# Patient Record
Sex: Female | Born: 1940 | Race: White | Hispanic: No
Health system: Southern US, Community
[De-identification: ages and names within clinical notes are randomized; demographics above are authoritative.]

## PROBLEM LIST (undated history)

## (undated) DIAGNOSIS — M1990 Unspecified osteoarthritis, unspecified site: Secondary | ICD-10-CM

## (undated) DIAGNOSIS — E119 Type 2 diabetes mellitus without complications: Secondary | ICD-10-CM

## (undated) DIAGNOSIS — I10 Essential (primary) hypertension: Secondary | ICD-10-CM

## (undated) DIAGNOSIS — I639 Cerebral infarction, unspecified: Secondary | ICD-10-CM

## (undated) DIAGNOSIS — E039 Hypothyroidism, unspecified: Secondary | ICD-10-CM

## (undated) HISTORY — PX: APPENDECTOMY: SHX54

## (undated) HISTORY — PX: CHOLECYSTECTOMY: SHX55

## (undated) HISTORY — PX: ABDOMINAL HYSTERECTOMY: SHX81

## (undated) HISTORY — PX: EYE SURGERY: SHX253

## (undated) HISTORY — PX: CARDIAC CATHETERIZATION: SHX172

## (undated) HISTORY — PX: JOINT REPLACEMENT: SHX530

---

## 2013-09-18 ENCOUNTER — Other Ambulatory Visit: Payer: Self-pay | Admitting: Orthopedic Surgery

## 2013-09-18 DIAGNOSIS — M542 Cervicalgia: Secondary | ICD-10-CM

## 2013-09-23 ENCOUNTER — Other Ambulatory Visit: Payer: Self-pay

## 2013-09-27 ENCOUNTER — Ambulatory Visit
Admission: RE | Admit: 2013-09-27 | Discharge: 2013-09-27 | Disposition: A | Discharge status: Home / Self Care | Payer: Medicare Other | Source: Ambulatory Visit | Attending: Orthopedic Surgery | Admitting: Orthopedic Surgery

## 2013-09-27 DIAGNOSIS — M542 Cervicalgia: Secondary | ICD-10-CM

## 2014-05-20 DIAGNOSIS — H269 Unspecified cataract: Secondary | ICD-10-CM | POA: Insufficient documentation

## 2014-06-10 DIAGNOSIS — H269 Unspecified cataract: Secondary | ICD-10-CM | POA: Insufficient documentation

## 2015-02-04 ENCOUNTER — Other Ambulatory Visit: Payer: Self-pay | Admitting: Neurosurgery

## 2015-02-04 DIAGNOSIS — M5412 Radiculopathy, cervical region: Secondary | ICD-10-CM

## 2015-02-10 ENCOUNTER — Ambulatory Visit
Admission: RE | Admit: 2015-02-10 | Discharge: 2015-02-10 | Disposition: A | Discharge status: Home / Self Care | Payer: Medicare Other | Source: Ambulatory Visit | Attending: Neurosurgery | Admitting: Neurosurgery

## 2015-02-10 DIAGNOSIS — M5412 Radiculopathy, cervical region: Secondary | ICD-10-CM

## 2015-02-10 MED ORDER — IOHEXOL 300 MG/ML  SOLN
10.0000 mL | Freq: Once | INTRAMUSCULAR | Status: AC | PRN
  Administered 2015-02-10: 10 mL via INTRATHECAL

## 2015-02-10 MED ORDER — ONDANSETRON HCL 4 MG/2ML IJ SOLN
4.0000 mg | Freq: Once | INTRAMUSCULAR | Status: AC
  Administered 2015-02-10: 4 mg via INTRAMUSCULAR

## 2015-02-10 MED ORDER — DIAZEPAM 5 MG PO TABS
5.0000 mg | ORAL_TABLET | Freq: Once | ORAL | Status: AC
  Administered 2015-02-10: 5 mg via ORAL

## 2015-02-10 MED ORDER — MEPERIDINE HCL 100 MG/ML IJ SOLN
100.0000 mg | Freq: Once | INTRAMUSCULAR | Status: AC
  Administered 2015-02-10: 100 mg via INTRAMUSCULAR

## 2015-02-10 NOTE — Progress Notes (Signed)
Patient states she has taken the prep for the iodinated contrast allergy as prescribed by Dr. Gerlene FeeKritzer.  She states she has been off Plavix since 02/06/15.  jkl

## 2015-02-10 NOTE — Discharge Instructions (Signed)

## 2015-02-21 ENCOUNTER — Other Ambulatory Visit: Payer: Self-pay | Admitting: Neurosurgery

## 2015-03-07 ENCOUNTER — Encounter (HOSPITAL_COMMUNITY)
Admission: RE | Admit: 2015-03-07 | Discharge: 2015-03-07 | Disposition: A | Discharge status: Home / Self Care | Payer: Medicare Other | Source: Ambulatory Visit | Attending: Neurosurgery | Admitting: Neurosurgery

## 2015-03-07 ENCOUNTER — Encounter (HOSPITAL_COMMUNITY): Payer: Self-pay

## 2015-03-07 DIAGNOSIS — Z01810 Encounter for preprocedural cardiovascular examination: Secondary | ICD-10-CM | POA: Diagnosis not present

## 2015-03-07 DIAGNOSIS — I10 Essential (primary) hypertension: Secondary | ICD-10-CM | POA: Diagnosis not present

## 2015-03-07 DIAGNOSIS — Z01812 Encounter for preprocedural laboratory examination: Secondary | ICD-10-CM | POA: Diagnosis present

## 2015-03-07 DIAGNOSIS — E119 Type 2 diabetes mellitus without complications: Secondary | ICD-10-CM | POA: Diagnosis not present

## 2015-03-07 DIAGNOSIS — Z01818 Encounter for other preprocedural examination: Secondary | ICD-10-CM | POA: Diagnosis not present

## 2015-03-07 DIAGNOSIS — R000 Tachycardia, unspecified: Secondary | ICD-10-CM | POA: Insufficient documentation

## 2015-03-07 HISTORY — DX: Hypothyroidism, unspecified: E03.9

## 2015-03-07 HISTORY — DX: Unspecified osteoarthritis, unspecified site: M19.90

## 2015-03-07 HISTORY — DX: Cerebral infarction, unspecified: I63.9

## 2015-03-07 HISTORY — DX: Essential (primary) hypertension: I10

## 2015-03-07 HISTORY — DX: Type 2 diabetes mellitus without complications: E11.9

## 2015-03-07 LAB — BASIC METABOLIC PANEL
Anion gap: 6 (ref 5–15)
BUN: 15 mg/dL (ref 6–23)
CHLORIDE: 103 mmol/L (ref 96–112)
CO2: 26 mmol/L (ref 19–32)
Calcium: 9.2 mg/dL (ref 8.4–10.5)
Creatinine, Ser: 0.71 mg/dL (ref 0.50–1.10)
GFR calc Af Amer: >90 mL/min (ref >90)
GFR calc non Af Amer: 84 mL/min — ABNORMAL LOW (ref >90)
GLUCOSE: 143 mg/dL — AB (ref 70–99)
POTASSIUM: 3.9 mmol/L (ref 3.5–5.1)
SODIUM: 135 mmol/L (ref 135–145)

## 2015-03-07 LAB — CBC
HEMATOCRIT: 34.3 % — AB (ref 36.0–46.0)
Hemoglobin: 10.9 g/dL — ABNORMAL LOW (ref 12.0–15.0)
MCH: 25.2 pg — ABNORMAL LOW (ref 26.0–34.0)
MCHC: 31.8 g/dL (ref 30.0–36.0)
MCV: 79.4 fL (ref 78.0–100.0)
PLATELETS: 377 10*3/uL (ref 150–400)
RBC: 4.32 MIL/uL (ref 3.87–5.11)
RDW: 14.8 % (ref 11.5–15.5)
WBC: 9.1 10*3/uL (ref 4.0–10.5)

## 2015-03-07 LAB — SURGICAL PCR SCREEN
MRSA, PCR: NEGATIVE
STAPHYLOCOCCUS AUREUS: NEGATIVE

## 2015-03-07 NOTE — Pre-Procedure Instructions (Signed)
Kim Rivas  03/07/2015   Your procedure is scheduled on:  03/15/15  Report to [facility]mitting at 530 AM.  Call this number if you have problems the morning of surgery: [phone]   Remember:   Do not eat food or drink liquids after midnight.   Take these medicines the morning of surgery with A SIP OF WATER: cardizem,nexium.synthroid   Do not wear jewelry, make-up or nail polish.  Do not wear lotions, powders, or perfumes. You may wear deodorant.  Do not shave 48 hours prior to surgery. Men may shave face and neck.  Do not bring valuables to the hospital.  [name] is not responsible                  for any belongings or valuables.               Contacts, dentures or bridgework may not be worn into surgery.  Leave suitcase in the car. After surgery it may be brought to your room.  For patients admitted to the hospital, discharge time is determined by your                treatment team.               Patients discharged the day of surgery will not be allowed to drive  home.  Name and phone number of your driver: family  Special Instructions: Shower using CHG 2 nights before surgery and the night before surgery.  If you shower the day of surgery use CHG.  Use special wash - you have one bottle of CHG for all showers.  You should use approximately 1/3 of the bottle for each shower.   Please read over the following fact sheets that you were given: Pain Booklet, Coughing and Deep Breathing, Blood Transfusion Information, MRSA Information and Surgical Site Infection Prevention  

## 2015-03-07 NOTE — Pre-Procedure Instructions (Signed)
Tonna BoehringerBetty Rivas  03/07/2015   Your procedure is scheduled on:  03/15/15  [facility] at 530 AM.  Call this number if you have problems the morning of surgery: [phone]   Remember:   Do not eat food or drink liquids after midnight.   Take these medicines the morning of surgery with A SIP OF WATER: cardizem,nexium.synthroid   Do not wear jewelry, make-up or nail polish.  Do not wear lotions, powders, or perfumes. You may wear deodorant.  Do not shave 48 hours prior to surgery. Men may shave face and neck.  Do not bring valuables to the hospital.  [facility] is not responsible                  for any belongings or valuables.               Contacts, dentures or bridgework may not be worn into surgery.  Leave suitcase in the car. After surgery it may be brought to your room.  For patients admitted to the hospital, discharge time is determined by your                treatment team.               Patients discharged the day of surgery will not be allowed to drive  home.  Name and phone number of your driver: family  Special Instructions: Shower using CHG 2 nights before surgery and the night before surgery.  If you shower the day of surgery use CHG.  Use special wash - you have one bottle of CHG for all showers.  You should use approximately 1/3 of the bottle for each shower.   Please read over the following fact sheets that you were given: Pain Booklet, Coughing and Deep Breathing, Blood Transfusion Information, MRSA Information and Surgical Site Infection Prevention

## 2015-03-08 ENCOUNTER — Encounter (HOSPITAL_COMMUNITY): Payer: Self-pay

## 2015-03-08 NOTE — Progress Notes (Signed)
Anesthesia Chart Review:  Patient is a 74 year old female scheduled for C5-6 ACDF on 03/15/15 by Dr. Gerlene FeeKritzer.  History includes HTN, DM2, remote history of TIA, hypothyroidism, arthritis, appendectomy, hysterectomy, cataract extraction '15. She reports she is a remote former smoker. PCP is Dr. Tarri Fullerichard Escajeda. Greater than 10 years ago she was evaluated at [facility] for tachycardia (SVT?) that ultimately resolved with IV medication.  She was seen by cardiologist Dr. Chales Abrahamsyson and was placed on verapamil--last year her PCP switched her to diltiazem due to verapamil no longer available.  She denies any recurrent tachy-palpitations since her initial presentation. She denied SOB and CP.  She is no longer followed by cardiology.     Meds include Plavix, diltiazem, Nexium, HCTZ, levothyroxine, losartan, metformin. Patient reported being instructed to hold Plavix starting 03/10/15.  EKG 03/07/15: ST at 114 bpm. She reported she was having a significant amount of left elbow pain.   Cardiac cath 06/30/04 ([facility]PR; done following false positive stress test on 06/05/04): Right dominant, normal coronary arteries, normal LV systolic function.  Preoperative labs noted. She reports her fasting glucose levels are typically ~ 150-160.  If no acute changes then I anticipate that she can proceed as planned.  Of note, she feels she may need something IV to relax her prior to surgery. I told her she could discuss with her anesthesia team on arrival the day of surgery.  She is currently a first case.  Velna Ochsllison Keita Demarco, PA-C [facility]Anesthesiology Phone [phone] 03/08/2015 5:08 PM

## 2015-03-14 MED ORDER — DEXAMETHASONE SODIUM PHOSPHATE 10 MG/ML IJ SOLN
10.0000 mg | INTRAMUSCULAR | Status: AC
Start: 2015-03-15 — End: 2015-03-15
  Administered 2015-03-15: 10 mg via INTRAVENOUS
  Filled 2015-03-14: qty 1

## 2015-03-14 MED ORDER — SODIUM CHLORIDE 0.9 % IV SOLN
1500.0000 mg | INTRAVENOUS | Status: AC
  Administered 2015-03-15: 1500 mg via INTRAVENOUS
  Filled 2015-03-14: qty 1500

## 2015-03-15 ENCOUNTER — Inpatient Hospital Stay (HOSPITAL_COMMUNITY)
Admission: RE | Admit: 2015-03-15 | Discharge: 2015-03-16 | DRG: 983 | Disposition: A | Discharge status: Home / Self Care | Payer: Medicare Other | Source: Ambulatory Visit | Attending: Neurosurgery | Admitting: Neurosurgery

## 2015-03-15 ENCOUNTER — Encounter (HOSPITAL_COMMUNITY): Payer: Self-pay

## 2015-03-15 ENCOUNTER — Encounter (HOSPITAL_COMMUNITY)
Admission: RE | Disposition: A | Discharge status: Home / Self Care | Payer: Self-pay | Source: Ambulatory Visit | Attending: Neurosurgery

## 2015-03-15 ENCOUNTER — Inpatient Hospital Stay (HOSPITAL_COMMUNITY): Payer: Medicare Other

## 2015-03-15 ENCOUNTER — Inpatient Hospital Stay (HOSPITAL_COMMUNITY): Payer: Medicare Other | Admitting: Vascular Surgery

## 2015-03-15 ENCOUNTER — Inpatient Hospital Stay (HOSPITAL_COMMUNITY): Payer: Medicare Other | Admitting: Critical Care Medicine

## 2015-03-15 DIAGNOSIS — Z7902 Long term (current) use of antithrombotics/antiplatelets: Secondary | ICD-10-CM | POA: Diagnosis not present

## 2015-03-15 DIAGNOSIS — Z8673 Personal history of transient ischemic attack (TIA), and cerebral infarction without residual deficits: Secondary | ICD-10-CM | POA: Diagnosis not present

## 2015-03-15 DIAGNOSIS — M79602 Pain in left arm: Secondary | ICD-10-CM | POA: Diagnosis present

## 2015-03-15 DIAGNOSIS — I10 Essential (primary) hypertension: Principal | ICD-10-CM | POA: Diagnosis present

## 2015-03-15 DIAGNOSIS — M5412 Radiculopathy, cervical region: Secondary | ICD-10-CM | POA: Diagnosis present

## 2015-03-15 DIAGNOSIS — M4722 Other spondylosis with radiculopathy, cervical region: Secondary | ICD-10-CM | POA: Diagnosis present

## 2015-03-15 DIAGNOSIS — E119 Type 2 diabetes mellitus without complications: Secondary | ICD-10-CM | POA: Diagnosis present

## 2015-03-15 DIAGNOSIS — E039 Hypothyroidism, unspecified: Secondary | ICD-10-CM | POA: Diagnosis present

## 2015-03-15 DIAGNOSIS — Z419 Encounter for procedure for purposes other than remedying health state, unspecified: Secondary | ICD-10-CM

## 2015-03-15 DIAGNOSIS — K219 Gastro-esophageal reflux disease without esophagitis: Secondary | ICD-10-CM | POA: Diagnosis present

## 2015-03-15 HISTORY — PX: ANTERIOR CERVICAL DECOMP/DISCECTOMY FUSION: SHX1161

## 2015-03-15 LAB — GLUCOSE, CAPILLARY
GLUCOSE-CAPILLARY: 155 mg/dL — AB (ref 70–99)
GLUCOSE-CAPILLARY: 189 mg/dL — AB (ref 70–99)
Glucose-Capillary: 215 mg/dL — ABNORMAL HIGH (ref 70–99)
Glucose-Capillary: 153 mg/dL — ABNORMAL HIGH (ref 70–99)
Glucose-Capillary: 127 mg/dL — ABNORMAL HIGH (ref 70–99)

## 2015-03-15 SURGERY — ANTERIOR CERVICAL DECOMPRESSION/DISCECTOMY FUSION 1 LEVEL
Anesthesia: General | Site: Spine Cervical

## 2015-03-15 MED ORDER — PHENYLEPHRINE HCL 10 MG/ML IJ SOLN
10.0000 mg | INTRAVENOUS | Status: DC | PRN
  Administered 2015-03-15: 25 ug/min via INTRAVENOUS

## 2015-03-15 MED ORDER — METHOCARBAMOL 500 MG PO TABS
500.0000 mg | ORAL_TABLET | Freq: Four times a day (QID) | ORAL | Status: DC | PRN
Start: 2015-03-15 — End: 2015-03-16
  Administered 2015-03-15 – 2015-03-16 (×2): 500 mg via ORAL
  Filled 2015-03-15: qty 1

## 2015-03-15 MED ORDER — POTASSIUM CHLORIDE IN NACL 20-0.45 MEQ/L-% IV SOLN
INTRAVENOUS | Status: DC
  Administered 2015-03-15: 13:00:00 via INTRAVENOUS
  Filled 2015-03-15 (×3): qty 1000

## 2015-03-15 MED ORDER — ARTIFICIAL TEARS OP OINT
TOPICAL_OINTMENT | OPHTHALMIC | Status: AC
  Filled 2015-03-15: qty 3.5

## 2015-03-15 MED ORDER — SODIUM CHLORIDE 0.9 % IJ SOLN
3.0000 mL | Freq: Two times a day (BID) | INTRAMUSCULAR | Status: DC
  Administered 2015-03-15: 3 mL via INTRAVENOUS

## 2015-03-15 MED ORDER — ROCURONIUM BROMIDE 100 MG/10ML IV SOLN
INTRAVENOUS | Status: DC | PRN
  Administered 2015-03-15: 30 mg via INTRAVENOUS

## 2015-03-15 MED ORDER — METHOCARBAMOL 500 MG PO TABS
ORAL_TABLET | ORAL | Status: AC
  Filled 2015-03-15: qty 1

## 2015-03-15 MED ORDER — PROPOFOL 10 MG/ML IV BOLUS
INTRAVENOUS | Status: DC | PRN
  Administered 2015-03-15: 40 mg via INTRAVENOUS
  Administered 2015-03-15: 160 mg via INTRAVENOUS

## 2015-03-15 MED ORDER — HYDROCODONE-ACETAMINOPHEN 5-325 MG PO TABS
ORAL_TABLET | ORAL | Status: AC
  Filled 2015-03-15: qty 2

## 2015-03-15 MED ORDER — PANTOPRAZOLE SODIUM 40 MG IV SOLR
40.0000 mg | Freq: Every day | INTRAVENOUS | Status: DC
  Administered 2015-03-15: 40 mg via INTRAVENOUS
  Filled 2015-03-15 (×2): qty 40

## 2015-03-15 MED ORDER — LACTATED RINGERS IV SOLN
INTRAVENOUS | Status: DC | PRN
  Administered 2015-03-15: 07:00:00 via INTRAVENOUS

## 2015-03-15 MED ORDER — LOSARTAN POTASSIUM 50 MG PO TABS
50.0000 mg | ORAL_TABLET | Freq: Every day | ORAL | Status: DC
  Administered 2015-03-16: 50 mg via ORAL
  Filled 2015-03-15 (×2): qty 1

## 2015-03-15 MED ORDER — GLYCOPYRROLATE 0.2 MG/ML IJ SOLN
INTRAMUSCULAR | Status: AC
  Filled 2015-03-15: qty 3

## 2015-03-15 MED ORDER — INSULIN ASPART 100 UNIT/ML ~~LOC~~ SOLN
0.0000 [IU] | Freq: Three times a day (TID) | SUBCUTANEOUS | Status: DC
  Administered 2015-03-15: 3 [IU] via SUBCUTANEOUS
  Administered 2015-03-15: 5 [IU] via SUBCUTANEOUS
  Administered 2015-03-16: 2 [IU] via SUBCUTANEOUS

## 2015-03-15 MED ORDER — FENTANYL CITRATE 0.05 MG/ML IJ SOLN
INTRAMUSCULAR | Status: AC
  Filled 2015-03-15: qty 5

## 2015-03-15 MED ORDER — 0.9 % SODIUM CHLORIDE (POUR BTL) OPTIME
TOPICAL | Status: DC | PRN
  Administered 2015-03-15: 1000 mL

## 2015-03-15 MED ORDER — PHENOL 1.4 % MT LIQD
1.0000 | OROMUCOSAL | Status: DC | PRN
  Administered 2015-03-15: 1 via OROMUCOSAL
  Filled 2015-03-15: qty 177

## 2015-03-15 MED ORDER — ARTIFICIAL TEARS OP OINT
TOPICAL_OINTMENT | OPHTHALMIC | Status: DC | PRN
  Administered 2015-03-15: 1 via OPHTHALMIC

## 2015-03-15 MED ORDER — ONDANSETRON HCL 4 MG/2ML IJ SOLN
INTRAMUSCULAR | Status: DC | PRN
  Administered 2015-03-15 (×2): 4 mg via INTRAVENOUS

## 2015-03-15 MED ORDER — MIDAZOLAM HCL 5 MG/5ML IJ SOLN
INTRAMUSCULAR | Status: DC | PRN
  Administered 2015-03-15: 2 mg via INTRAVENOUS

## 2015-03-15 MED ORDER — SODIUM CHLORIDE 0.9 % IR SOLN
Status: DC | PRN
  Administered 2015-03-15: 500 mL

## 2015-03-15 MED ORDER — PROMETHAZINE HCL 25 MG/ML IJ SOLN: 6.2500 mg | INTRAMUSCULAR | Status: DC | PRN

## 2015-03-15 MED ORDER — INSULIN ASPART 100 UNIT/ML ~~LOC~~ SOLN
4.0000 [IU] | Freq: Three times a day (TID) | SUBCUTANEOUS | Status: DC
  Administered 2015-03-15 – 2015-03-16 (×3): 4 [IU] via SUBCUTANEOUS

## 2015-03-15 MED ORDER — INSULIN ASPART 100 UNIT/ML ~~LOC~~ SOLN: 0.0000 [IU] | Freq: Every day | SUBCUTANEOUS | Status: DC

## 2015-03-15 MED ORDER — FENTANYL CITRATE 0.05 MG/ML IJ SOLN
INTRAMUSCULAR | Status: DC | PRN
  Administered 2015-03-15 (×5): 50 ug via INTRAVENOUS

## 2015-03-15 MED ORDER — ROCURONIUM BROMIDE 50 MG/5ML IV SOLN
INTRAVENOUS | Status: AC
  Filled 2015-03-15: qty 1

## 2015-03-15 MED ORDER — ACETAMINOPHEN 650 MG RE SUPP
650.0000 mg | RECTAL | Status: DC | PRN
Start: 2015-03-15 — End: 2015-03-16

## 2015-03-15 MED ORDER — SODIUM CHLORIDE 0.9 % IV SOLN
INTRAVENOUS | Status: DC | PRN
  Administered 2015-03-15: 08:00:00 via INTRAVENOUS

## 2015-03-15 MED ORDER — VANCOMYCIN HCL IN DEXTROSE 1-5 GM/200ML-% IV SOLN
1000.0000 mg | Freq: Once | INTRAVENOUS | Status: AC
  Administered 2015-03-15: 1000 mg via INTRAVENOUS
  Filled 2015-03-15: qty 200

## 2015-03-15 MED ORDER — PHENYLEPHRINE 40 MCG/ML (10ML) SYRINGE FOR IV PUSH (FOR BLOOD PRESSURE SUPPORT)
PREFILLED_SYRINGE | INTRAVENOUS | Status: AC
  Filled 2015-03-15: qty 10

## 2015-03-15 MED ORDER — THROMBIN 5000 UNITS EX SOLR
CUTANEOUS | Status: DC | PRN
  Administered 2015-03-15 (×2): 5000 [IU] via TOPICAL

## 2015-03-15 MED ORDER — LIDOCAINE HCL (CARDIAC) 20 MG/ML IV SOLN
INTRAVENOUS | Status: DC | PRN
  Administered 2015-03-15: 80 mg via INTRAVENOUS
  Administered 2015-03-15: 100 mg via INTRATRACHEAL

## 2015-03-15 MED ORDER — PROMETHAZINE HCL 25 MG/ML IJ SOLN
12.5000 mg | Freq: Four times a day (QID) | INTRAMUSCULAR | Status: DC | PRN
  Administered 2015-03-15: 12.5 mg via INTRAVENOUS
  Filled 2015-03-15: qty 1

## 2015-03-15 MED ORDER — METHOCARBAMOL 1000 MG/10ML IJ SOLN
500.0000 mg | INTRAMUSCULAR | Status: DC
  Filled 2015-03-15: qty 5

## 2015-03-15 MED ORDER — LIDOCAINE HCL (CARDIAC) 20 MG/ML IV SOLN
INTRAVENOUS | Status: AC
  Filled 2015-03-15: qty 5

## 2015-03-15 MED ORDER — EPHEDRINE SULFATE 50 MG/ML IJ SOLN
INTRAMUSCULAR | Status: AC
Start: 2015-03-15 — End: 2015-03-15
  Filled 2015-03-15: qty 1

## 2015-03-15 MED ORDER — SODIUM CHLORIDE 0.9 % IJ SOLN
INTRAMUSCULAR | Status: AC
  Filled 2015-03-15: qty 10

## 2015-03-15 MED ORDER — DOCUSATE SODIUM 100 MG PO CAPS
100.0000 mg | ORAL_CAPSULE | Freq: Two times a day (BID) | ORAL | Status: DC
  Administered 2015-03-16: 100 mg via ORAL
  Filled 2015-03-15: qty 1

## 2015-03-15 MED ORDER — ONDANSETRON HCL 4 MG/2ML IJ SOLN
INTRAMUSCULAR | Status: AC
  Filled 2015-03-15: qty 2

## 2015-03-15 MED ORDER — HEMOSTATIC AGENTS (NO CHARGE) OPTIME
TOPICAL | Status: DC | PRN
  Administered 2015-03-15: 1 via TOPICAL

## 2015-03-15 MED ORDER — MIDAZOLAM HCL 2 MG/2ML IJ SOLN
INTRAMUSCULAR | Status: AC
  Filled 2015-03-15: qty 2

## 2015-03-15 MED ORDER — HYDROMORPHONE HCL 1 MG/ML IJ SOLN
INTRAMUSCULAR | Status: AC
  Filled 2015-03-15: qty 1

## 2015-03-15 MED ORDER — HYDROCHLOROTHIAZIDE 25 MG PO TABS
12.5000 mg | ORAL_TABLET | Freq: Every day | ORAL | Status: DC
  Filled 2015-03-15: qty 0.5

## 2015-03-15 MED ORDER — PROPOFOL 10 MG/ML IV BOLUS
INTRAVENOUS | Status: AC
  Filled 2015-03-15: qty 20

## 2015-03-15 MED ORDER — SODIUM CHLORIDE 0.9 % IJ SOLN: 3.0000 mL | INTRAMUSCULAR | Status: DC | PRN

## 2015-03-15 MED ORDER — HYDROMORPHONE HCL 1 MG/ML IJ SOLN
1.0000 mg | INTRAMUSCULAR | Status: DC | PRN
  Administered 2015-03-15: 1 mg via INTRAMUSCULAR
  Filled 2015-03-15: qty 1

## 2015-03-15 MED ORDER — ONDANSETRON HCL 4 MG/2ML IJ SOLN
4.0000 mg | INTRAMUSCULAR | Status: DC | PRN
  Administered 2015-03-15 (×2): 4 mg via INTRAVENOUS
  Filled 2015-03-15: qty 2

## 2015-03-15 MED ORDER — GLYCOPYRROLATE 0.2 MG/ML IJ SOLN
INTRAMUSCULAR | Status: DC | PRN
  Administered 2015-03-15: 0.6 mg via INTRAVENOUS

## 2015-03-15 MED ORDER — HYDROCHLOROTHIAZIDE 12.5 MG PO CAPS
12.5000 mg | ORAL_CAPSULE | Freq: Every day | ORAL | Status: DC
  Administered 2015-03-16: 12.5 mg via ORAL
  Filled 2015-03-15 (×2): qty 1

## 2015-03-15 MED ORDER — HYDROMORPHONE HCL 1 MG/ML IJ SOLN
0.2500 mg | INTRAMUSCULAR | Status: DC | PRN
  Administered 2015-03-15 (×4): 0.5 mg via INTRAVENOUS

## 2015-03-15 MED ORDER — MENTHOL 3 MG MT LOZG: 1.0000 | LOZENGE | OROMUCOSAL | Status: DC | PRN

## 2015-03-15 MED ORDER — DILTIAZEM HCL 60 MG PO TABS
120.0000 mg | ORAL_TABLET | Freq: Every day | ORAL | Status: DC
  Administered 2015-03-16: 120 mg via ORAL
  Filled 2015-03-15: qty 2

## 2015-03-15 MED ORDER — ACETAMINOPHEN 325 MG PO TABS: 650.0000 mg | ORAL_TABLET | ORAL | Status: DC | PRN

## 2015-03-15 MED ORDER — PHENYLEPHRINE HCL 10 MG/ML IJ SOLN
INTRAMUSCULAR | Status: DC | PRN
  Administered 2015-03-15: 40 ug via INTRAVENOUS
  Administered 2015-03-15 (×3): 80 ug via INTRAVENOUS
  Administered 2015-03-15: 120 ug via INTRAVENOUS

## 2015-03-15 MED ORDER — LEVOTHYROXINE SODIUM 50 MCG PO TABS
50.0000 ug | ORAL_TABLET | Freq: Every day | ORAL | Status: DC
  Administered 2015-03-16: 50 ug via ORAL
  Filled 2015-03-15 (×2): qty 1

## 2015-03-15 MED ORDER — METFORMIN HCL 500 MG PO TABS
500.0000 mg | ORAL_TABLET | Freq: Two times a day (BID) | ORAL | Status: DC
  Administered 2015-03-15 – 2015-03-16 (×2): 500 mg via ORAL
  Filled 2015-03-15 (×4): qty 1

## 2015-03-15 MED ORDER — HYDROCODONE-ACETAMINOPHEN 5-325 MG PO TABS
1.0000 | ORAL_TABLET | ORAL | Status: DC | PRN
  Administered 2015-03-15 – 2015-03-16 (×2): 2 via ORAL
  Filled 2015-03-15: qty 2

## 2015-03-15 MED ORDER — DEXTROSE 5 % IV SOLN: 500.0000 mg | Freq: Four times a day (QID) | INTRAVENOUS | Status: DC | PRN

## 2015-03-15 MED ORDER — NEOSTIGMINE METHYLSULFATE 10 MG/10ML IV SOLN
INTRAVENOUS | Status: DC | PRN
  Administered 2015-03-15: 4 mg via INTRAVENOUS

## 2015-03-15 MED ORDER — SUCCINYLCHOLINE CHLORIDE 20 MG/ML IJ SOLN
INTRAMUSCULAR | Status: AC
  Filled 2015-03-15: qty 1

## 2015-03-15 SURGICAL SUPPLY — 62 items
BAG DECANTER FOR FLEXI CONT (MISCELLANEOUS) ×3 IMPLANT
BENZOIN TINCTURE PRP APPL 2/3 (GAUZE/BANDAGES/DRESSINGS) ×3 IMPLANT
BIT DRILL INVIZIA (BIT) ×1 IMPLANT
BRUSH SCRUB EZ PLAIN DRY (MISCELLANEOUS) ×3 IMPLANT
BUR MATCHSTICK NEURO 3.0 LAGG (BURR) ×3 IMPLANT
CANISTER SUCT 3000ML PPV (MISCELLANEOUS) ×3 IMPLANT
CLOSURE WOUND 1/2 X4 (GAUZE/BANDAGES/DRESSINGS) ×1
CONT SPEC 4OZ CLIKSEAL STRL BL (MISCELLANEOUS) ×3 IMPLANT
DRAPE C-ARM 42X72 X-RAY (DRAPES) ×6 IMPLANT
DRAPE LAPAROTOMY 100X72 PEDS (DRAPES) ×3 IMPLANT
DRAPE MICROSCOPE LEICA (MISCELLANEOUS) ×3 IMPLANT
DRAPE POUCH INSTRU U-SHP 10X18 (DRAPES) ×3 IMPLANT
DRAPE SURG 17X23 STRL (DRAPES) ×6 IMPLANT
DRILL BIT INVIZIA (BIT) ×3
DRSG OPSITE POSTOP 3X4 (GAUZE/BANDAGES/DRESSINGS) ×3 IMPLANT
DRSG TELFA 3X8 NADH (GAUZE/BANDAGES/DRESSINGS) IMPLANT
DURAPREP 6ML APPLICATOR 50/CS (WOUND CARE) ×3 IMPLANT
ELECT COATED BLADE 2.86 ST (ELECTRODE) ×3 IMPLANT
ELECT REM PT RETURN 9FT ADLT (ELECTROSURGICAL) ×3
ELECTRODE REM PT RTRN 9FT ADLT (ELECTROSURGICAL) ×1 IMPLANT
GAUZE SPONGE 4X4 12PLY STRL (GAUZE/BANDAGES/DRESSINGS) IMPLANT
GAUZE SPONGE 4X4 16PLY XRAY LF (GAUZE/BANDAGES/DRESSINGS) IMPLANT
GLOVE BIOGEL PI IND STRL 7.5 (GLOVE) ×1 IMPLANT
GLOVE BIOGEL PI IND STRL 8 (GLOVE) ×2 IMPLANT
GLOVE BIOGEL PI INDICATOR 7.5 (GLOVE) ×2
GLOVE BIOGEL PI INDICATOR 8 (GLOVE) ×4
GLOVE ECLIPSE 8.0 STRL XLNG CF (GLOVE) IMPLANT
GLOVE EXAM NITRILE LRG STRL (GLOVE) IMPLANT
GLOVE EXAM NITRILE XL STR (GLOVE) IMPLANT
GLOVE EXAM NITRILE XS STR PU (GLOVE) IMPLANT
GLOVE SURG SS PI 7.0 STRL IVOR (GLOVE) ×3 IMPLANT
GLOVE SURG SS PI 7.5 STRL IVOR (GLOVE) ×6 IMPLANT
GLOVE SURG SS PI 8.0 STRL IVOR (GLOVE) ×6 IMPLANT
GOWN STRL REUS W/ TWL LRG LVL3 (GOWN DISPOSABLE) ×1 IMPLANT
GOWN STRL REUS W/ TWL XL LVL3 (GOWN DISPOSABLE) ×1 IMPLANT
GOWN STRL REUS W/TWL 2XL LVL3 (GOWN DISPOSABLE) ×3 IMPLANT
GOWN STRL REUS W/TWL LRG LVL3 (GOWN DISPOSABLE) ×2
GOWN STRL REUS W/TWL XL LVL3 (GOWN DISPOSABLE) ×2
HALTER HD/CHIN CERV TRACTION D (MISCELLANEOUS) ×3 IMPLANT
KIT BASIN OR (CUSTOM PROCEDURE TRAY) ×3 IMPLANT
KIT ROOM TURNOVER OR (KITS) ×3 IMPLANT
NEEDLE SPNL 20GX3.5 QUINCKE YW (NEEDLE) ×3 IMPLANT
NS IRRIG 1000ML POUR BTL (IV SOLUTION) ×3 IMPLANT
PACK LAMINECTOMY NEURO (CUSTOM PROCEDURE TRAY) ×3 IMPLANT
PAD ARMBOARD 7.5X6 YLW CONV (MISCELLANEOUS) ×9 IMPLANT
PATTIES SURGICAL .25X.25 (GAUZE/BANDAGES/DRESSINGS) IMPLANT
PATTIES SURGICAL .75X.75 (GAUZE/BANDAGES/DRESSINGS) IMPLANT
PLATE INVIZIA 1 LEV 22MM (Plate) ×3 IMPLANT
PUTTY BONE GRAFT KIT 2.5ML (Bone Implant) ×3 IMPLANT
RUBBERBAND STERILE (MISCELLANEOUS) ×6 IMPLANT
SCREW SD FIXED 12MM (Screw) ×12 IMPLANT
SPACER TMS 11X14X6MM (Spacer) ×3 IMPLANT
SPONGE INTESTINAL PEANUT (DISPOSABLE) ×3 IMPLANT
SPONGE SURGIFOAM ABS GEL SZ50 (HEMOSTASIS) ×3 IMPLANT
STRIP CLOSURE SKIN 1/2X4 (GAUZE/BANDAGES/DRESSINGS) ×2 IMPLANT
SUT PDS AB 5-0 P3 18 (SUTURE) ×3 IMPLANT
SUT VIC AB 3-0 CP2 18 (SUTURE) ×6 IMPLANT
SYR 20ML ECCENTRIC (SYRINGE) ×3 IMPLANT
TOWEL OR 17X24 6PK STRL BLUE (TOWEL DISPOSABLE) ×3 IMPLANT
TOWEL OR 17X26 10 PK STRL BLUE (TOWEL DISPOSABLE) ×3 IMPLANT
TRAP SPECIMEN MUCOUS 40CC (MISCELLANEOUS) ×3 IMPLANT
WATER STERILE IRR 1000ML POUR (IV SOLUTION) ×3 IMPLANT

## 2015-03-15 NOTE — Plan of Care (Signed)
Problem: Consults Goal: Diagnosis - Spinal Surgery Outcome: Completed/Met Date Met:  03/15/15 Cervical Spine Fusion

## 2015-03-15 NOTE — Anesthesia Postprocedure Evaluation (Signed)
  Anesthesia Post-op Note  Patient: Kim Rivas  Procedure(s) Performed: Procedure(s): ANTERIOR CERVICAL DECOMPRESSION/DISCECTOMY FUSION CERVICAL FIVE-SIX (N/A)  Patient Location: PACU  Anesthesia Type:General  Level of Consciousness: awake  Airway and Oxygen Therapy: Patient Spontanous Breathing  Post-op Pain: mild  Post-op Assessment: Post-op Vital signs reviewed  Post-op Vital Signs: Reviewed  Last Vitals:  Filed Vitals:   03/15/15 1003  BP:   Pulse: 92  Temp:   Resp: 21    Complications: No apparent anesthesia complications

## 2015-03-15 NOTE — Op Note (Signed)
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 Localizing x-rays taken prior to incision to identify the appropriate level. Transverse incision was made in the right anterior neck started midline and headed towards the medial aspect of the sternal cremaster muscle. The platysma muscle was then incised transversely and the natural fascial plane between the strap muscles medially sternal cremaster laterally was identified and followed down to the anterior aspect the cervical spine. Longus Cole muscles were identified split the midline and stripped away bilaterally with unipolar coagulation and Kitner dissection. Subcutaneous tract was placed for exposure Dr. should approach the appropriate level. Using a 15 blade the acid disc at C5-6 was incised. Using pituitary rongeurs and curettes proximal and 90% of disc material was removed. High-speed drill was used to widen the interspace and bony shavings were saved for use later in the case. At this time the microscope was draped brought in the field and used for the remainder of the case. His microsecond technique the remainder the disc material down the posterior longitudinal ligament was removed. Ligament was incised transversely and the cut edges removed a Kerrison punch. Thorough decompression was carried out on the spinal dura into the foramen bilaterally particularly on the left, symmetric side were bony overgrowth the disc material was identified compressing the C6 nerve these were removed until the C6 nerve root was well visualized well decompressed. At this time inspection was carried out in all directions for any  evidence of residual compression and none could be identified. Irrigation was carried out and any bleeding control proper coagulation Gelfoam. Measurements were taken and a 6 mm lordotic trabecular metal graft was chosen and filled with a mixture of autologous bone and morselized allograft. Was impacted without difficulty and fossae showed to be in good position. An appropriately length anterior cervical plate was then chosen. Under fluoroscopic guidance drill holes were placed followed by placing of 12 Miller screws 4. Locking mechanism was rotated locked position final fossae showed good position of the plates screws and plugs. Irrigation was carried out and any bleeding control proper coagulation. The was then closed with inverted Vicryl on the platysma muscle and subcuticular layer and Steri-Strips were placed on the skin. Sterile dressing was then applied and the patient was extubated and taken to recovery room in stable condition.

## 2015-03-15 NOTE — H&P (Signed)
Kim Rivas is an 74 y.o. female.   Chief Complaint: Left scapular and arm pain HPI: The patient is a 74 year old female who is evaluated in the office for left scapular and arm pain. She's had problems for a year but is much worse over the last few months. There is no inciting event. She been told the past that she has spurs in her cervical spine. She's tried oxycodone without relief and also tried anti-inflammatory medication. An MRI scan was done which was evaluated in the office. The time right arm was a symptomatic. She did note some tingling by the left elbow. Her MRI scan was reviewed and showed some spondylosis but was hard to say with there was definite nerve root compression. She therefore underwent myelography which showed nerve root compression at C5-6 on the left, symmetric side and this fit well with her clinical presentation. After discussing the options the patient requested surgery now comes for C5-6 anterior cervical discectomy with fusion and plating. I've had a long discussion with her regarding the risks and benefits of surgical intervention. The risks discussed include but are not limited to bleeding infection weakness some as paralysis spinal fluid leak coma quadriplegia hoarseness and death. We have discussed alternative methods of therapy although risks and benefits of nonintervention. She's had the opportunity numerous questions and appears to understand. With this information in hand she has requested we proceed with surgery.  Past Medical History  Diagnosis Date  . Hypertension   . Diabetes mellitus without complication   . Hypothyroidism   . Stroke     tia  . Arthritis     Past Surgical History  Procedure Laterality Date  . Cholecystectomy    . Appendectomy    . Cardiac catheterization      clean    17 yrs ago  . Eye surgery    . Abdominal hysterectomy    . Joint replacement      2 knee scopes, shoulder RC    History reviewed. No pertinent family  history. Social History:  reports that she does not drink alcohol or use illicit drugs. Her tobacco history is not on file.  Allergies:  Allergies  Allergen Reactions  . Contrast Media [Iodinated Diagnostic Agents] Shortness Of Breath, Swelling and Anaphylaxis    Pre med  . Latex Swelling and Rash  . Penicillins Nausea And Vomiting, Swelling and Rash    Medications Prior to Admission  Medication Sig Dispense Refill  . Aspirin-Salicylamide-Caffeine (BC HEADACHE POWDER PO) Take 1 packet by mouth daily as needed (headache).    . clopidogrel (PLAVIX) 75 MG tablet Take 75 mg by mouth daily.     . Coenzyme Q10 (CO Q 10) 100 MG CAPS Take 100 mg by mouth daily.    Marland Kitchen. diltiazem (CARDIZEM) 120 MG tablet Take 120 mg by mouth daily.    Marland Kitchen. esomeprazole (NEXIUM) 40 MG capsule Take 40 mg by mouth 2 (two) times daily.     . hydrochlorothiazide (HYDRODIURIL) 12.5 MG tablet Take 12.5 mg by mouth daily.     Marland Kitchen. ibuprofen (ADVIL,MOTRIN) 200 MG tablet Take 200 mg by mouth every 6 (six) hours as needed.    Marland Kitchen. levothyroxine (SYNTHROID, LEVOTHROID) 50 MCG tablet Take 50 mcg by mouth daily before breakfast.    . losartan (COZAAR) 50 MG tablet Take 50 mg by mouth daily.    . metFORMIN (GLUCOPHAGE) 500 MG tablet Take 500 mg by mouth 2 (two) times daily.     Marland Kitchen. RA KRILL  OIL 500 MG CAPS Take 500 mg by mouth daily.    . Red Yeast Rice 600 MG CAPS Take 1,200 mg by mouth daily.      Results for orders placed or performed during the hospital encounter of 03/15/15 (from the past 48 hour(s))  Glucose, capillary     Status: Abnormal   Collection Time: 03/15/15  6:28 AM  Result Value Ref Range   Glucose-Capillary 155 (H) 70 - 99 mg/dL   No results found.  Positive for cataracts high blood pressure cholesterol diabetes and thyroid disease  Blood pressure 155/64, pulse 94, temperature 98 F (36.7 C), resp. rate 20, SpO2 95 %.  The patient is awake alert and oriented. She has no facial asymmetry. her strength is intact.  Reflexes are decreased.  Assessment/Plan impression is that of cervical spondylosis at C5-6. The plan is for a C5-6 anterior cervical discectomy with fusion and plating.  Reinaldo Meeker, MD 03/15/2015, 7:24 AM

## 2015-03-15 NOTE — Progress Notes (Signed)
ANTIBIOTIC CONSULT NOTE - INITIAL  Pharmacy Consult for Vancomycin Indication: post-op prophylaxis  Allergies  Allergen Reactions  . Contrast Media [Iodinated Diagnostic Agents] Shortness Of Breath, Swelling and Anaphylaxis    Pre med  . Latex Swelling and Rash  . Penicillins Nausea And Vomiting, Swelling and Rash    Patient Measurements:    Vital Signs: Temp: 98.3 F (36.8 C) (03/15 1049) BP: 125/85 mmHg (03/15 1049) Pulse Rate: 93 (03/15 1049) Intake/Output from previous day:   Intake/Output from this shift: Total I/O In: 1540 [P.O.:240; I.V.:1300] Out: 50 [Blood:50]  Labs: No results for input(s): WBC, HGB, PLT, LABCREA, CREATININE in the last 72 hours. Estimated Creatinine Clearance: 67 mL/min (by C-G formula based on Cr of 0.71). No results for input(s): VANCOTROUGH, VANCOPEAK, VANCORANDOM, GENTTROUGH, GENTPEAK, GENTRANDOM, TOBRATROUGH, TOBRAPEAK, TOBRARND, AMIKACINPEAK, AMIKACINTROU, AMIKACIN in the last 72 hours.   Microbiology: Recent Results (from the past 720 hour(s))  Surgical pcr screen     Status: None   Collection Time: 03/07/15  9:43 AM  Result Value Ref Range Status   MRSA, PCR NEGATIVE NEGATIVE Final   Staphylococcus aureus NEGATIVE NEGATIVE Final    Comment:        The Xpert SA Assay (FDA approved for NASAL specimens in patients over 81 years of age), is one component of a comprehensive surveillance program.  Test performance has been validated by [facility] for patients greater than or equal to 64 year old. It is not intended to diagnose infection nor to guide or monitor treatment.     Medical History: Past Medical History  Diagnosis Date  . Hypertension   . Diabetes mellitus without complication   . Hypothyroidism   . Stroke     tia  . Arthritis     Medications:  Prescriptions prior to admission  Medication Sig Dispense Refill Last Dose  . Aspirin-Salicylamide-Caffeine (BC HEADACHE POWDER PO) Take 1 packet by mouth daily as  needed (headache).   03/14/2015 at Unknown time  . clopidogrel (PLAVIX) 75 MG tablet Take 75 mg by mouth daily.    03/09/2015  . Coenzyme Q10 (CO Q 10) 100 MG CAPS Take 100 mg by mouth daily.   03/14/2015  . diltiazem (CARDIZEM) 120 MG tablet Take 120 mg by mouth daily.   03/15/2015 at Unknown time  . esomeprazole (NEXIUM) 40 MG capsule Take 40 mg by mouth 2 (two) times daily.    03/15/2015 at Unknown time  . hydrochlorothiazide (HYDRODIURIL) 12.5 MG tablet Take 12.5 mg by mouth daily.    03/14/2015 at Unknown time  . ibuprofen (ADVIL,MOTRIN) 200 MG tablet Take 200 mg by mouth every 6 (six) hours as needed.     Marland Kitchen levothyroxine (SYNTHROID, LEVOTHROID) 50 MCG tablet Take 50 mcg by mouth daily before breakfast.   03/15/2015 at Unknown time  . losartan (COZAAR) 50 MG tablet Take 50 mg by mouth daily.   03/14/2015 at Unknown time  . metFORMIN (GLUCOPHAGE) 500 MG tablet Take 500 mg by mouth 2 (two) times daily.    03/13/2015  . RA KRILL OIL 500 MG CAPS Take 500 mg by mouth daily.   03/14/2015 at Unknown time  . Red Yeast Rice 600 MG CAPS Take 1,200 mg by mouth daily.   03/14/2015 at Unknown time   Assessment: 74 y.o. female s/p C5-6 decompressive cervical discectomy. Pt received Vanc  IV pre-op at 0730. To receive one dose of post-op Vancomycin. Last SCr 0.71 on 3/7, est CrCl 65 ml/min. No drain in place.  Goal of Therapy:  Vancomycin trough 10-15 mcg/ml  Plan:  1. Vancomycin 1gm IV x 1 at 1930 (~12 hr post pre-op dose) 2. Pharmacy will sign off. Please reconsult if needed.  Christoper Fabianaron Zenita Kister, PharmD, BCPS Clinical pharmacist, pager [phone] 03/15/2015,11:05 AM

## 2015-03-15 NOTE — Anesthesia Procedure Notes (Signed)
Procedure Name: Intubation Date/Time: 03/15/2015 7:30 AM Performed by: Merrilyn Puma B Pre-anesthesia Checklist: Patient identified, Timeout performed, Emergency Drugs available, Suction available and Patient being monitored Patient Re-evaluated:Patient Re-evaluated prior to inductionOxygen Delivery Method: Circle system utilized Preoxygenation: Pre-oxygenation with 100% oxygen Intubation Type: IV induction, Cricoid Pressure applied and Rapid sequence Laryngoscope Size: Mac and 3 Grade View: Grade I Tube type: Oral Tube size: 7.0 mm Number of attempts: 1 Airway Equipment and Method: Stylet and LTA kit utilized (LTA down ETT after intubation) Placement Confirmation: ETT inserted through vocal cords under direct vision,  positive ETCO2,  CO2 detector and breath sounds checked- equal and bilateral Secured at: 21 cm Tube secured with: Tape Dental Injury: Teeth and Oropharynx as per pre-operative assessment

## 2015-03-15 NOTE — Anesthesia Preprocedure Evaluation (Addendum)
Anesthesia Evaluation  Patient identified by MRN, date of birth, ID band Patient awake    Reviewed: Allergy & Precautions, NPO status   Airway Mallampati: II  TM Distance: >3 FB Neck ROM: Full    Dental  (+) Dental Advisory Given, Edentulous Upper, Edentulous Lower, Upper Dentures, Lower Dentures Metal screws in mouth for dental plates:   Pulmonary neg pulmonary ROS,  breath sounds clear to auscultation        Cardiovascular hypertension, Rhythm:Regular Rate:Normal     Neuro/Psych CVA    GI/Hepatic Neg liver ROS, GERD-  Medicated,  Endo/Other  diabetes, Type 2, Oral Hypoglycemic AgentsHypothyroidism   Renal/GU negative Renal ROS     Musculoskeletal  (+) Arthritis -,   Abdominal   Peds  Hematology   Anesthesia Other Findings   Reproductive/Obstetrics                          Anesthesia Physical Anesthesia Plan  ASA: III  Anesthesia Plan: General   Post-op Pain Management:    Induction: Intravenous  Airway Management Planned: Oral ETT  Additional Equipment:   Intra-op Plan:   Post-operative Plan: Extubation in OR  Informed Consent: I have reviewed the patients History and Physical, chart, labs and discussed the procedure including the risks, benefits and alternatives for the proposed anesthesia with the patient or authorized representative who has indicated his/her understanding and acceptance.   Dental advisory given  Plan Discussed with: Anesthesiologist, Surgeon and CRNA  Anesthesia Plan Comments:        Anesthesia Quick Evaluation

## 2015-03-15 NOTE — Transfer of Care (Signed)
Immediate Anesthesia Transfer of Care Note  Patient: Kim Rivas  Procedure(s) Performed: Procedure(s): ANTERIOR CERVICAL DECOMPRESSION/DISCECTOMY FUSION CERVICAL FIVE-SIX (N/A)  Patient Location: PACU  Anesthesia Type:General  Level of Consciousness: awake and alert   Airway & Oxygen Therapy: Patient Spontanous Breathing and Patient connected to nasal cannula oxygen  Post-op Assessment: Report given to RN, Post -op Vital signs reviewed and stable and Patient moving all extremities X 4  Post vital signs: Reviewed and stable  Last Vitals:  Filed Vitals:   03/15/15 0623  BP: 155/64  Pulse: 94  Temp: 36.7 C  Resp: 20    Complications: No apparent anesthesia complications

## 2015-03-16 ENCOUNTER — Encounter (HOSPITAL_COMMUNITY): Payer: Self-pay | Admitting: Neurosurgery

## 2015-03-16 LAB — GLUCOSE, CAPILLARY: Glucose-Capillary: 129 mg/dL — ABNORMAL HIGH (ref 70–99)

## 2015-03-16 MED ORDER — HYDROCODONE-ACETAMINOPHEN 5-325 MG PO TABS: 1.0000 | ORAL_TABLET | ORAL | Status: AC | PRN

## 2015-03-16 NOTE — Progress Notes (Signed)
Patient alert and oriented, mae's well, voiding adequate amount of urine, swallowing without difficulty, no c/o pain. Patient discharged home with family. Script and discharged instructions given to patient. RN informed patient to start Plavix 03/17/15 per MD order. Patient and family stated understanding of d/c instructions given and has an appointment with MD.  Marin RobertsAisha Cybill Uriegas RN

## 2015-03-16 NOTE — Discharge Summary (Signed)
  Physician Discharge Summary  Patient ID: Kim Rivas MRN: [id number] DOB/AGE: 1941/07/18 74 y.o.  Admit date: 03/15/2015 Discharge date: 03/16/2015  Admission Diagnoses:  Discharge Diagnoses:  Active Problems:   Cervical radiculopathy   Discharged Condition: good  Hospital Course: Surgery yesterday for 1 level acdf. Did well w marked improvement in her arm pain. No issues over night. Ambulated well. Home pod 1, specific instructions given.  Consults: None  Significant Diagnostic Studies: none  Treatments: surgery: C 56 acdf  Discharge Exam: Blood pressure 143/90, pulse 101, temperature 98.6 F (37 C), temperature source Oral, resp. rate 20, SpO2 96 %. Incision/Wound:clean and dry; no new neuro issues  Disposition: Final discharge disposition not confirmed     Medication List    ASK your doctor about these medications        BC HEADACHE POWDER PO  Take 1 packet by mouth daily as needed (headache).     clopidogrel 75 MG tablet  Commonly known as:  PLAVIX  Take 75 mg by mouth daily.     Co Q 10 100 MG Caps  Take 100 mg by mouth daily.     diltiazem 120 MG tablet  Commonly known as:  CARDIZEM  Take 120 mg by mouth daily.     esomeprazole 40 MG capsule  Commonly known as:  NEXIUM  Take 40 mg by mouth 2 (two) times daily.     hydrochlorothiazide 12.5 MG tablet  Commonly known as:  HYDRODIURIL  Take 12.5 mg by mouth daily.     ibuprofen 200 MG tablet  Commonly known as:  ADVIL,MOTRIN  Take 200 mg by mouth every 6 (six) hours as needed.     levothyroxine 50 MCG tablet  Commonly known as:  SYNTHROID, LEVOTHROID  Take 50 mcg by mouth daily before breakfast.     losartan 50 MG tablet  Commonly known as:  COZAAR  Take 50 mg by mouth daily.     metFORMIN 500 MG tablet  Commonly known as:  GLUCOPHAGE  Take 500 mg by mouth 2 (two) times daily.     RA KRILL OIL 500 MG Caps  Take 500 mg by mouth daily.     Red Yeast Rice 600 MG Caps  Take 1,200 mg by  mouth daily.         At home rest most of the time. Get up 9 or 10 times each day and take a 15 or 20 minute walk. No riding in the car and to your first postoperative appointment. If you have neck surgery you may shower from the chest down starting on the third postoperative day. If you had back surgery he may start showering on the third postoperative day with saran wrap wrapped around your incisional area 3 times. After the shower remove the saran wrap. Take pain medicine as needed and other medications as instructed. Call my office for an appointment.  SignedReinaldo Meeker: Mackenzie Lia O, MD 03/16/2015, 8:33 AM

## 2015-06-09 ENCOUNTER — Other Ambulatory Visit: Payer: Self-pay | Admitting: Neurosurgery

## 2015-06-09 DIAGNOSIS — M5412 Radiculopathy, cervical region: Secondary | ICD-10-CM

## 2015-06-24 ENCOUNTER — Ambulatory Visit
Admission: RE | Admit: 2015-06-24 | Discharge: 2015-06-24 | Disposition: A | Discharge status: Home / Self Care | Payer: Medicare Other | Source: Ambulatory Visit | Attending: Neurosurgery | Admitting: Neurosurgery

## 2015-06-24 DIAGNOSIS — M5412 Radiculopathy, cervical region: Secondary | ICD-10-CM

## 2016-01-24 IMAGING — RF DG CERVICAL SPINE 1V
1 series · 1 of 1 positions shown · non-contrast
Comparison: 02/10/2015.

CLINICAL DATA: Anterior cervical fusion at the C5-6 level.

EXAM:
DG CERVICAL SPINE - 1 VIEW; DG C-ARM 61-120 MIN

[Series 1: run · 1 of 1 slices shown]
[im 1/1]
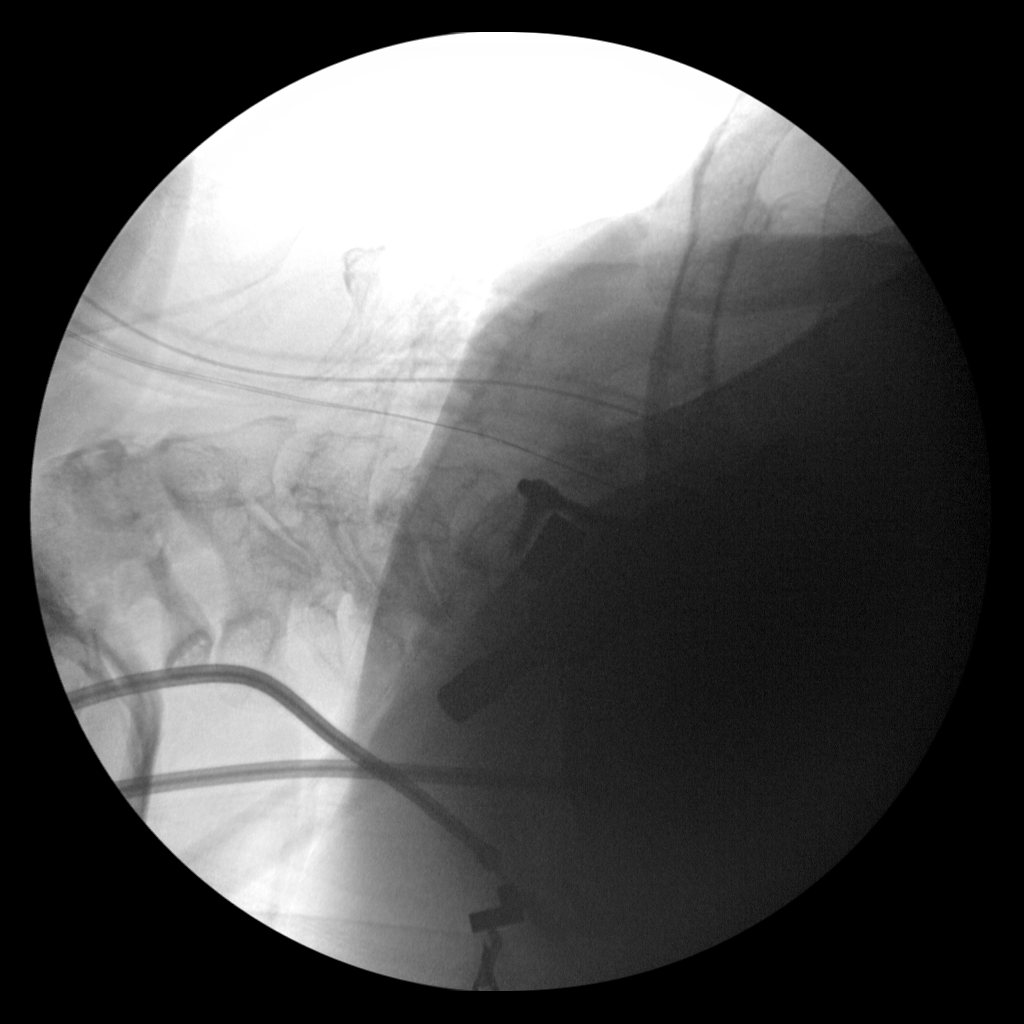

[1 of 1 positions shown; findings below may reference images not displayed]

FINDINGS: Interbody and anterior screw and plate fusion at the C5-6 level.
Alignment cannot be determined due to overlapping of the patient's
shoulder. The C6 level and below are obscured.
IMPRESSION: Anterior cervical fusion at the C5-6 level.

## 2022-08-31 DEATH — deceased
